# Patient Record
Sex: Female | Born: 1935 | Race: White | Hispanic: No | Smoking: Never smoker
Health system: Southern US, Community
[De-identification: ages and names within clinical notes are randomized; demographics above are authoritative.]

## PROBLEM LIST (undated history)

## (undated) DIAGNOSIS — H269 Unspecified cataract: Secondary | ICD-10-CM

## (undated) DIAGNOSIS — I10 Essential (primary) hypertension: Secondary | ICD-10-CM

## (undated) DIAGNOSIS — E119 Type 2 diabetes mellitus without complications: Secondary | ICD-10-CM

## (undated) HISTORY — DX: Essential (primary) hypertension: I10

## (undated) HISTORY — DX: Type 2 diabetes mellitus without complications: E11.9

## (undated) HISTORY — DX: Unspecified cataract: H26.9

## (undated) HISTORY — PX: EYE SURGERY: SHX253

## (undated) HISTORY — PX: HERNIA REPAIR: SHX51

---

## 2001-03-16 ENCOUNTER — Other Ambulatory Visit: Admission: RE | Admit: 2001-03-16 | Discharge: 2001-03-16 | Payer: Self-pay | Admitting: Internal Medicine

## 2001-03-31 ENCOUNTER — Encounter: Admission: RE | Admit: 2001-03-31 | Discharge: 2001-06-29 | Payer: Self-pay | Admitting: Internal Medicine

## 2001-10-20 ENCOUNTER — Encounter: Admission: RE | Admit: 2001-10-20 | Discharge: 2002-01-18 | Payer: Self-pay | Admitting: Internal Medicine

## 2004-03-25 ENCOUNTER — Observation Stay (HOSPITAL_COMMUNITY): Admission: RE | Admit: 2004-03-25 | Discharge: 2004-03-25 | Payer: Self-pay | Admitting: Orthopedic Surgery

## 2008-10-04 IMAGING — CR DG HAND*L* EXAM
1 series · 3 of 3 positions shown · non-contrast
Comparison: NONE

CLINICAL DATA: Small nodule. 

LEFT HAND

[Series 1: view not recorded · 0.17mm/px · 3 of 3 slices shown]
[im 1/3]
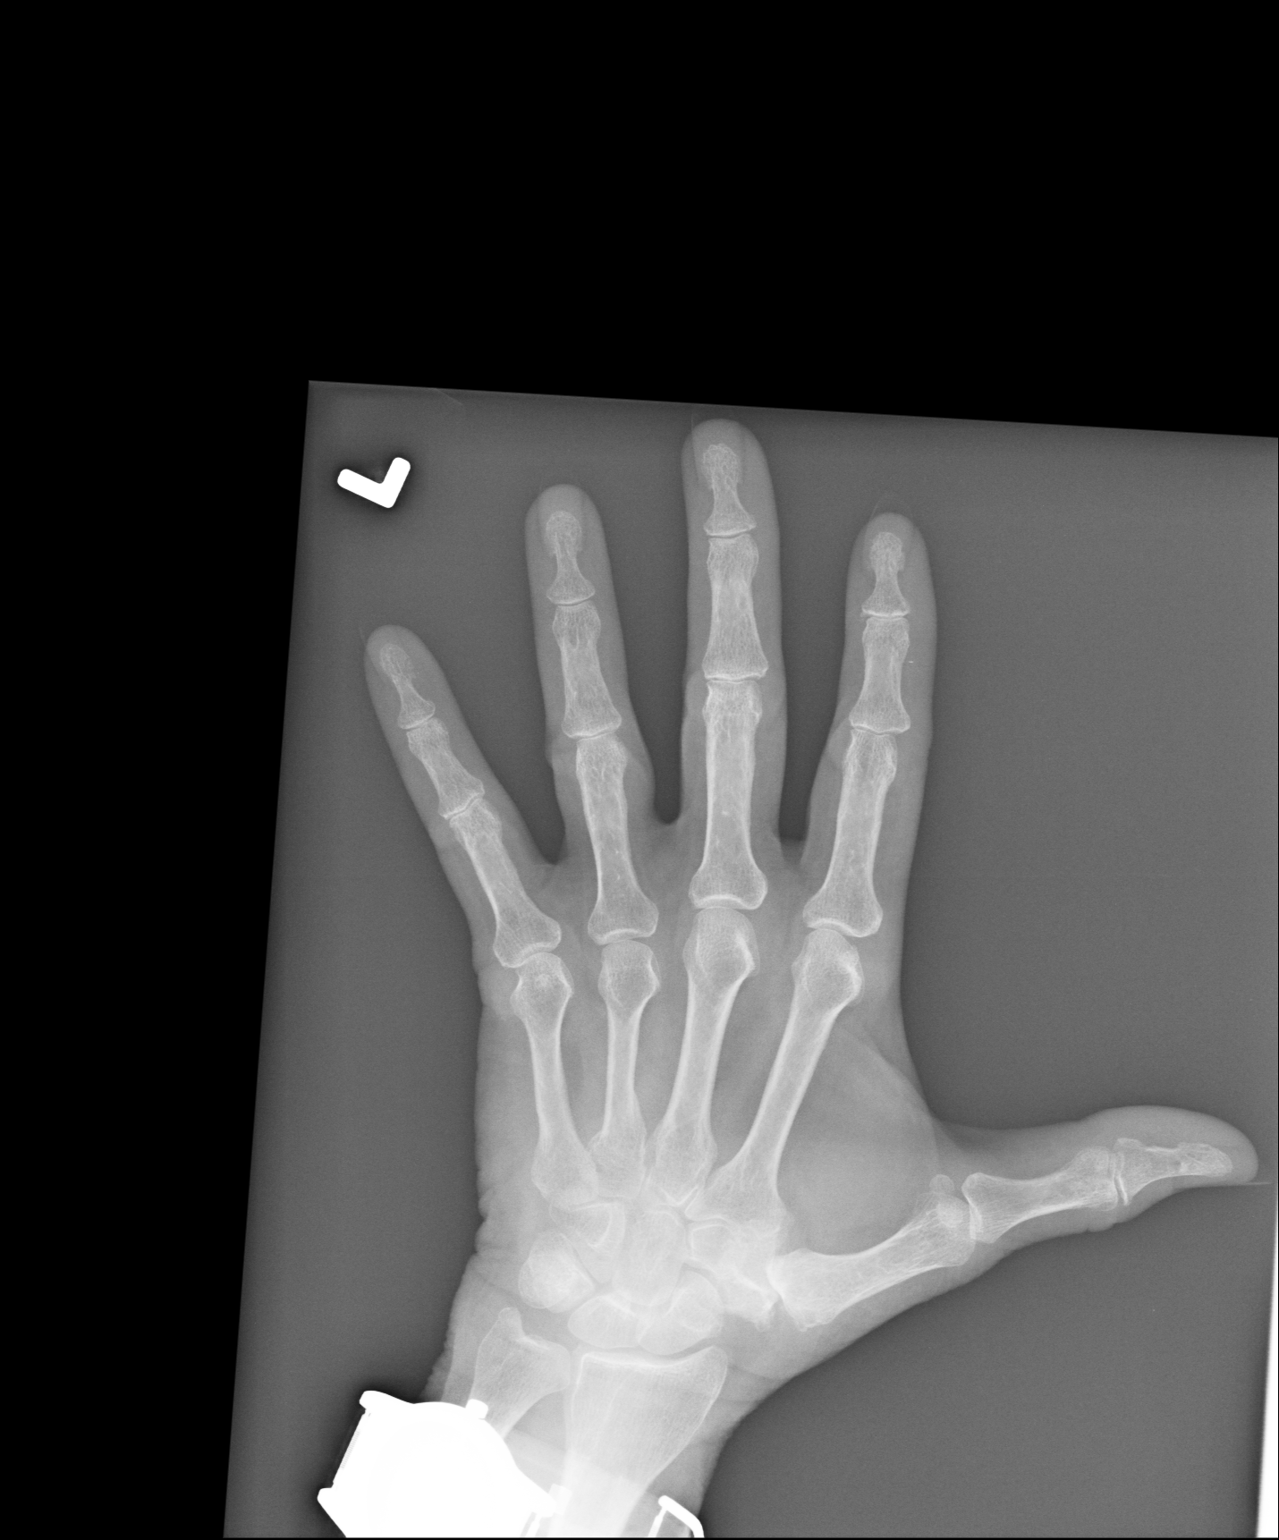
[im 2/3]
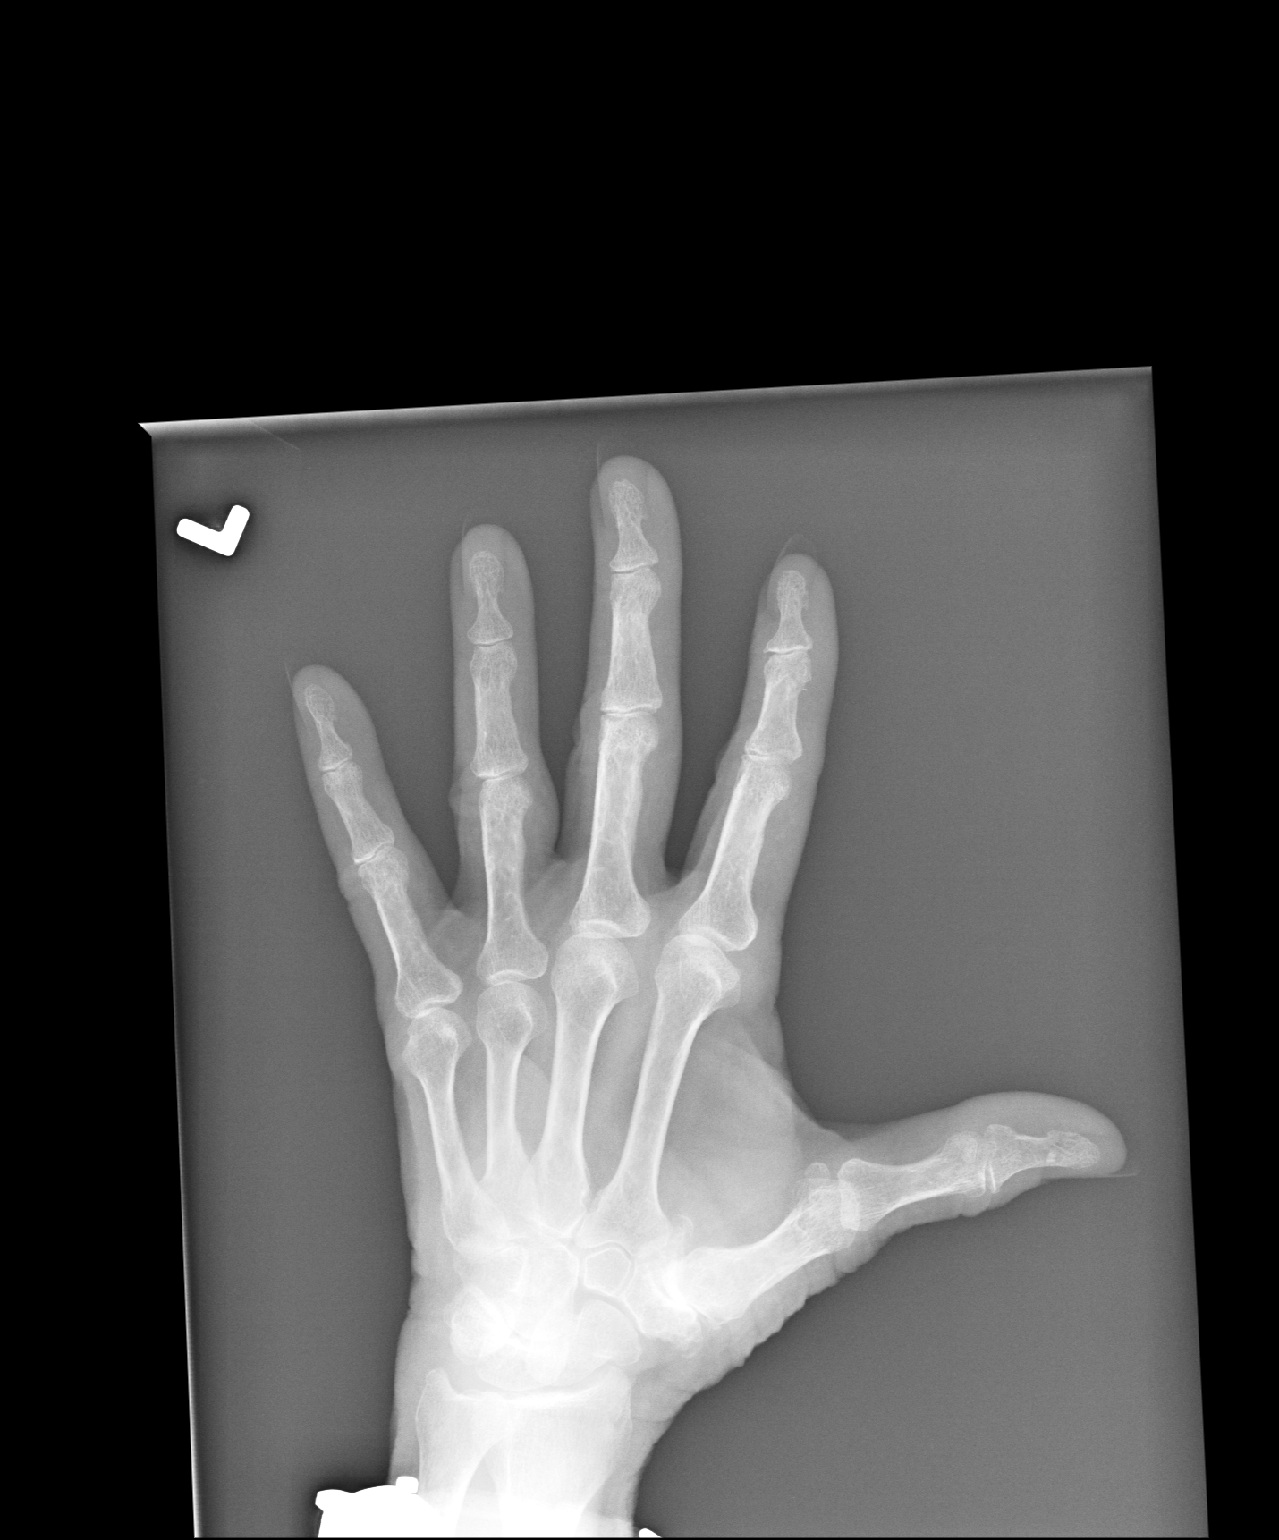
[im 3/3]
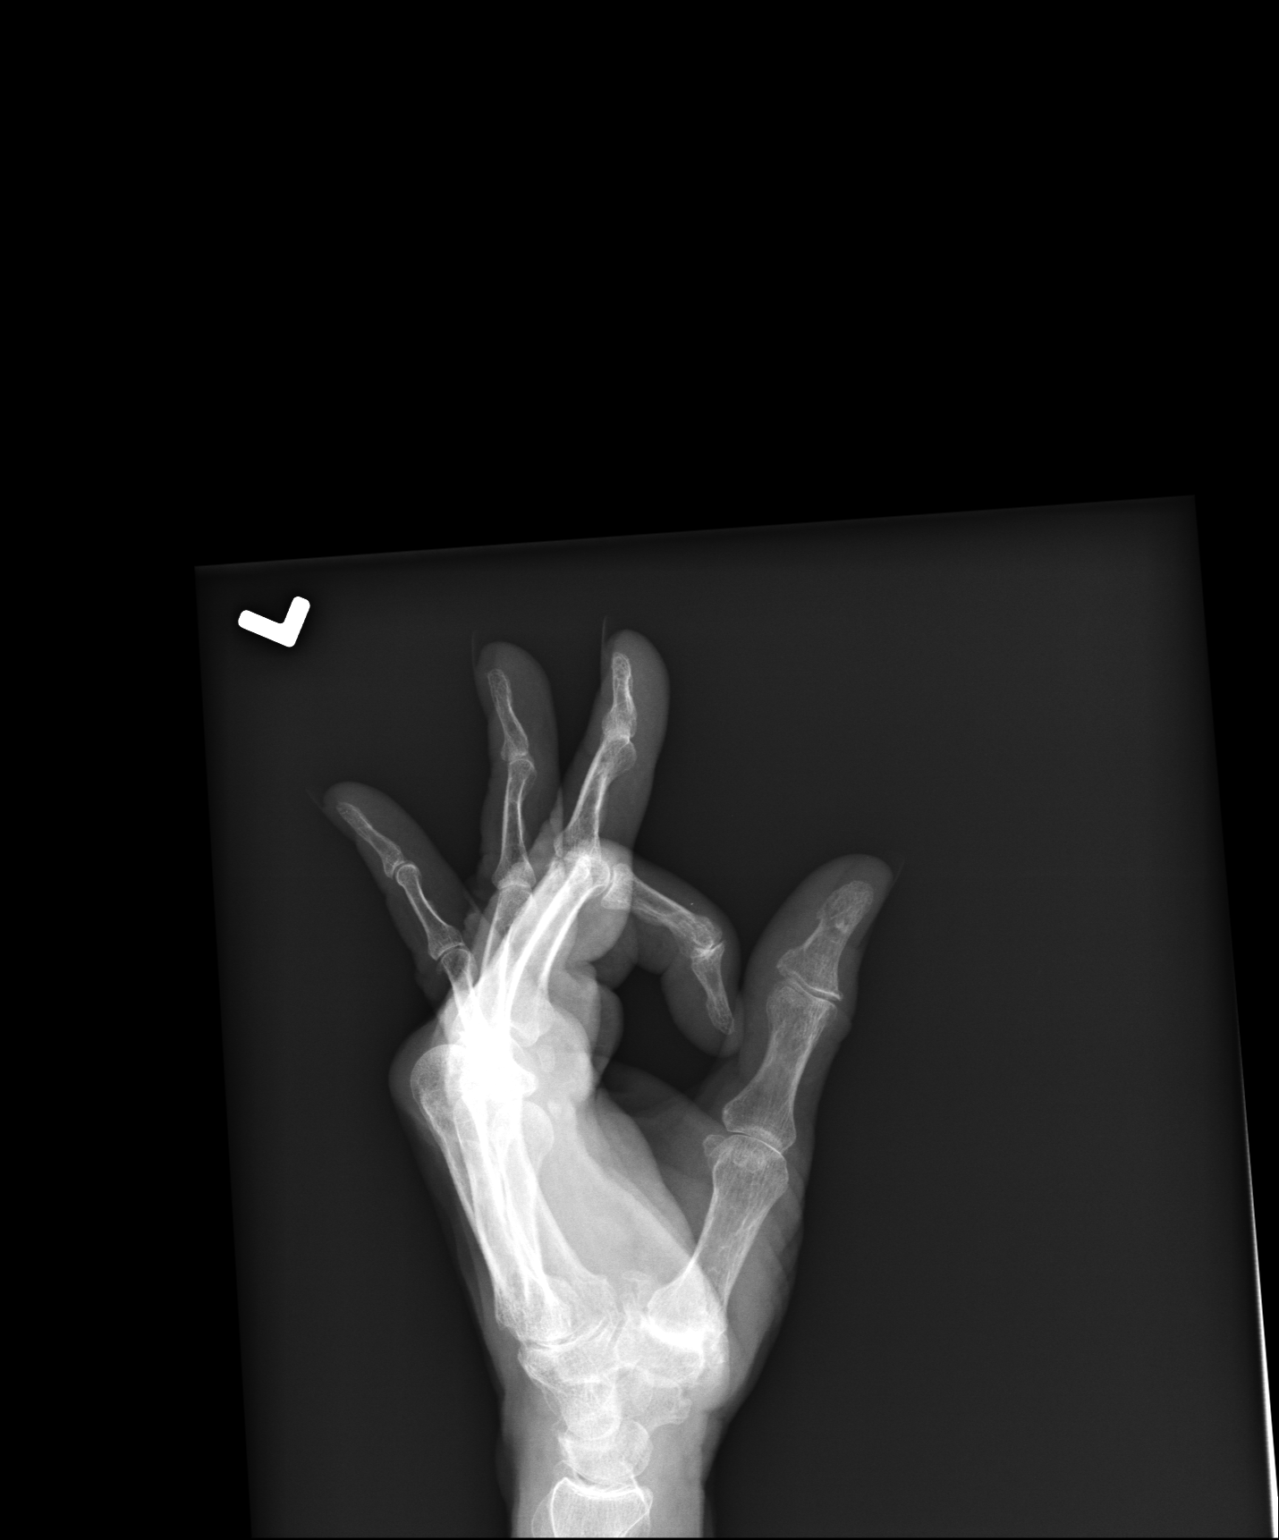

[3 of 3 positions shown; findings below may reference images not displayed]

FINDINGS: Scattered focal osteoarthritic changes are identified 
in the distal interphalangeal joints. An area of focal bony 
hypertrophic spurring is noted at the level of the trapezium, 
extending between the bases of metacarpals one and two. There is 
joint space narrowing of the trapezium and first metacarpal 
articulation.
IMPRESSION: Osteoarthritic changes at the base of the first 
metacarpal with hypertrophic bone spurring and joint space 
narrowing. The left hand is otherwise negative. Sashuna Bronw, 
Trans Date: 08/02/2008 [REDACTED]  [REDACTED]

## 2011-01-04 ENCOUNTER — Encounter: Payer: Self-pay | Admitting: Family Medicine

## 2012-03-28 DIAGNOSIS — J309 Allergic rhinitis, unspecified: Secondary | ICD-10-CM | POA: Diagnosis not present

## 2012-03-28 DIAGNOSIS — I129 Hypertensive chronic kidney disease with stage 1 through stage 4 chronic kidney disease, or unspecified chronic kidney disease: Secondary | ICD-10-CM | POA: Diagnosis not present

## 2012-03-28 DIAGNOSIS — E1129 Type 2 diabetes mellitus with other diabetic kidney complication: Secondary | ICD-10-CM | POA: Diagnosis not present

## 2012-03-28 DIAGNOSIS — Z1331 Encounter for screening for depression: Secondary | ICD-10-CM | POA: Diagnosis not present

## 2012-03-28 DIAGNOSIS — N182 Chronic kidney disease, stage 2 (mild): Secondary | ICD-10-CM | POA: Diagnosis not present

## 2012-03-28 DIAGNOSIS — E785 Hyperlipidemia, unspecified: Secondary | ICD-10-CM | POA: Diagnosis not present

## 2012-06-30 DIAGNOSIS — D235 Other benign neoplasm of skin of trunk: Secondary | ICD-10-CM | POA: Diagnosis not present

## 2012-08-25 DIAGNOSIS — D485 Neoplasm of uncertain behavior of skin: Secondary | ICD-10-CM | POA: Diagnosis not present

## 2012-08-25 DIAGNOSIS — L259 Unspecified contact dermatitis, unspecified cause: Secondary | ICD-10-CM | POA: Diagnosis not present

## 2012-09-23 ENCOUNTER — Ambulatory Visit (INDEPENDENT_AMBULATORY_CARE_PROVIDER_SITE_OTHER): Payer: Medicare Other | Admitting: Family Medicine

## 2012-09-23 VITALS — BP 159/76 | HR 65 | Temp 98.2°F | Resp 20 | Ht 61.0 in | Wt 128.0 lb

## 2012-09-23 DIAGNOSIS — J309 Allergic rhinitis, unspecified: Secondary | ICD-10-CM

## 2012-09-23 DIAGNOSIS — L989 Disorder of the skin and subcutaneous tissue, unspecified: Secondary | ICD-10-CM

## 2012-09-23 DIAGNOSIS — R040 Epistaxis: Secondary | ICD-10-CM

## 2012-09-23 MED ORDER — FEXOFENADINE HCL 180 MG PO TABS: 180.0000 mg | ORAL_TABLET | Freq: Every day | ORAL | Status: DC

## 2012-09-23 NOTE — Progress Notes (Signed)
@[facility] @   Patient ID: Angel Castaneda MRN: [id number], DOB: 07/20/1929, 78 y.o. Date of Encounter: 09/23/2012, 5:37 PM  Primary Physician: No primary provider on file.  Chief Complaint:  Chief Complaint  Patient presents with  . Sinusitis    Post nasal drainage.    . Cough  . Foreign Body in Nose    Thinks there may be an orange seed in her nose x today    HPI: 78 y.o. year old female presents with 7 day history of nasal congestion, post nasal drip, sore throat, sinus pressure, and cough. Afebrile. No chills. Nasal congestion thick and green/yellow. Sinus pressure is the worst symptom. Cough is productive secondary to post nasal drip and not associated with time of day. Ears feel full, leading to sensation of muffled hearing. Has tried OTC cold preps without success. No GI complaints. Thought she may have introduced a seed into left nostril this morning and has been picking at the nose all day.  She has had some epistaxis today.  Also has cerumen impaction  No recent antibiotics, recent travels, or sick contacts   No leg trauma, sedentary periods, h/o cancer, or tobacco use.  Past Medical History  Diagnosis Date  . Cataract   . Diabetes mellitus without complication     Borderline     Home Meds: Prior to Admission medications   Medication Sig Start Date End Date Taking? Authorizing Provider  losartan (COZAAR) 50 MG tablet Take 50 mg by mouth daily.   Yes Historical Provider, MD    Allergies:  Allergies  Allergen Reactions  . Codeine Nausea And Vomiting    Gets very ill.    History   Social History  . Marital Status: Widowed    Spouse Name: N/A    Number of Children: N/A  . Years of Education: N/A   Occupational History  . Not on file.   Social History Main Topics  . Smoking status: Never Smoker   . Smokeless tobacco: Not on file  . Alcohol Use: Not on file  . Drug Use: Not on file  . Sexually Active: Not on file   Other Topics Concern  . Not on  file   Social History Narrative  . No narrative on file     Review of Systems: Constitutional: negative for chills, fever, night sweats or weight changes Cardiovascular: negative for chest pain or palpitations Respiratory: negative for hemoptysis, wheezing, or shortness of breath Abdominal: negative for abdominal pain, nausea, vomiting or diarrhea Dermatological: negative for rash Neurologic: negative for headache   Physical Exam: Blood pressure 159/76, pulse 65, temperature 98.2 F (36.8 C), temperature source Oral, resp. rate 20, height 5\' 1"  (1.549 m), weight 128 lb (58.06 kg), SpO2 98.00%., Body mass index is 24.19 kg/(m^2). General: Well developed, well nourished, in no acute distress. Head: Normocephalic, atraumatic, eyes without discharge, sclera non-icteric, nares are congested. Bilateral auditory canals clear after lavage, Cerumen impaction bilaterally. Maxillary sinus TTP. Oral cavity moist, dentition normal. Posterior pharynx with post nasal drip and mild erythema. No peritonsillar abscess or tonsillar exudate.  Nose shows dry fresh blood left septum but no F.B. Neck: Supple. No thyromegaly. Full ROM. No lymphadenopathy. Lungs: Clear bilaterally to auscultation without wheezes, rales, or rhonchi. Breathing is unlabored.  Heart: RRR with S1 S2. No murmurs, rubs, or gallops appreciated. Msk:  Strength and tone normal for age. Extremities: No clubbing or cyanosis. No edema. Neuro: Alert and oriented X 3. Moves all extremities spontaneously. CNII-XII grossly in tact.  Psych:  Responds to questions appropriately with a normal affect.  Skin:  Red macular lesion 1 mm on dorsum or left hand Labs:   ASSESSMENT AND PLAN:  78 y.o. year old female with allergic rhinitis, skin lesion left hand (being treated by dermatologist) -  -Tylenol/Motrin prn -Rest/fluids -RTC precautions -RTC 3-5 days if no improvement  Signed, Elvina Sidle, MD 09/23/2012 5:37 PM

## 2012-10-17 DIAGNOSIS — I129 Hypertensive chronic kidney disease with stage 1 through stage 4 chronic kidney disease, or unspecified chronic kidney disease: Secondary | ICD-10-CM | POA: Diagnosis not present

## 2012-10-17 DIAGNOSIS — R109 Unspecified abdominal pain: Secondary | ICD-10-CM | POA: Diagnosis not present

## 2012-10-17 DIAGNOSIS — N182 Chronic kidney disease, stage 2 (mild): Secondary | ICD-10-CM | POA: Diagnosis not present

## 2012-10-17 DIAGNOSIS — E785 Hyperlipidemia, unspecified: Secondary | ICD-10-CM | POA: Diagnosis not present

## 2012-10-17 DIAGNOSIS — E1129 Type 2 diabetes mellitus with other diabetic kidney complication: Secondary | ICD-10-CM | POA: Diagnosis not present

## 2012-12-14 ENCOUNTER — Ambulatory Visit: Payer: Medicare Other

## 2012-12-14 ENCOUNTER — Ambulatory Visit (INDEPENDENT_AMBULATORY_CARE_PROVIDER_SITE_OTHER): Payer: Medicare Other | Admitting: Family Medicine

## 2012-12-14 VITALS — BP 175/76 | HR 80 | Temp 98.1°F | Resp 18 | Ht 61.0 in | Wt 123.6 lb

## 2012-12-14 DIAGNOSIS — S40029A Contusion of unspecified upper arm, initial encounter: Secondary | ICD-10-CM

## 2012-12-14 DIAGNOSIS — M79609 Pain in unspecified limb: Secondary | ICD-10-CM

## 2012-12-14 DIAGNOSIS — S42253A Displaced fracture of greater tuberosity of unspecified humerus, initial encounter for closed fracture: Secondary | ICD-10-CM

## 2012-12-14 DIAGNOSIS — S40022A Contusion of left upper arm, initial encounter: Secondary | ICD-10-CM

## 2012-12-14 DIAGNOSIS — M79602 Pain in left arm: Secondary | ICD-10-CM

## 2012-12-14 NOTE — Progress Notes (Signed)
[address]   [place], [place]  [address]   [phone]  Subjective:    Patient ID: Angel Castaneda, female    DOB: 1929/08/24, 79 y.o.   MRN: [id number]  HPIThis 79 y.o. female presents for evaluation of arm pain after fall one week ago.  Larey Seat two weeks ago; did not tell anyone due to holidays.  Raining and going down steps; slipped down steps and fell into yard.  Fell forward on outstretched arms; injured L arm.  R hand dominant.  No head trauma.  Had coat on which cushioned fall.  Pain along upper arm along tricep and elbow; also radiates into forearm.  +bruise proximal arm.  No n/t in arm.  Good range of motion.  No swelling to arm.  No associated neck pain.  Taken Tylenol ES.  Did not take medication last night other than ASA 81mg .   No history of osteoporosis; history of bone density scan in past but not sure of results.  No dizziness, lightheadedness, diaphoresis prior to fall.  PCP:  Laurann Montana with [facility].   Review of Systems  Constitutional: Negative for fever, chills, diaphoresis and fatigue.  Musculoskeletal: Positive for myalgias and arthralgias. Negative for joint swelling.  Neurological: Negative for weakness and numbness.  Hematological: Bruises/bleeds easily.        Past Medical History  Diagnosis Date  . Cataract   . Diabetes mellitus without complication     Borderline  . Hypertension     Past Surgical History  Procedure Date  . Eye surgery   . Hernia repair     Prior to Admission medications   Medication Sig Start Date End Date Taking? Authorizing Provider  losartan (COZAAR) 50 MG tablet Take 50 mg by mouth daily.   Yes Historical Provider, MD  fexofenadine (ALLEGRA) 180 MG tablet Take 1 tablet (180 mg total) by mouth daily. 09/23/12   Elvina Sidle, MD    Allergies  Allergen Reactions  . Codeine Nausea And Vomiting    Gets very ill.    History   Social History  . Marital Status: Widowed    Spouse Name: N/A    Number of Children: N/A    . Years of Education: N/A   Occupational History  . Not on file.   Social History Main Topics  . Smoking status: Never Smoker   . Smokeless tobacco: Not on file  . Alcohol Use: Not on file  . Drug Use: Not on file  . Sexually Active: Not on file   Other Topics Concern  . Not on file   Social History Narrative   Marital status:  Widowed   Children: four children; 2 grandchildren; 1 great grandchild   Lives: with son   Employment: retired   Tobacco: none   Alcohol:  None   Exercise:  walking    Family History  Problem Relation Age of Onset  . Diabetes Mother   . Heart disease Sister   . Stroke Sister   . Mental illness Son   . Diabetes Maternal Aunt   . Cancer Maternal Aunt   . Heart disease Maternal Uncle   . Diabetes Maternal Grandmother   . Heart disease Maternal Grandfather     Objective:   Physical Exam  Nursing note and vitals reviewed. Constitutional: She is oriented to person, place, and time. She appears well-developed and well-nourished. No distress.  Neck: Normal range of motion. Neck supple.  Cardiovascular: Normal rate, regular rhythm and normal heart sounds.  Exam reveals no gallop and no friction rub.   No murmur heard. Pulses:      Radial pulses are 2+ on the right side, and 2+ on the left side.       Capillary refill < 3 seconds LUE.  Pulmonary/Chest: Effort normal and breath sounds normal. No respiratory distress. She has no wheezes. She has no rales.  Musculoskeletal:       Left shoulder: She exhibits normal range of motion, no tenderness, no bony tenderness, no swelling, no effusion and no deformity.       Cervical back: She exhibits normal range of motion, no tenderness and no bony tenderness.       Left upper arm: She exhibits tenderness and bony tenderness. She exhibits no swelling, no edema, no deformity and no laceration.       Left forearm: She exhibits no tenderness, no bony tenderness, no swelling and no edema.       Left hand: She exhibits  normal range of motion, no tenderness and no bony tenderness. normal sensation noted. Normal strength noted.       CERVICAL SPINE: FULL ROM; NON-TENDER TO PALPATION PARASPINAL AND MIDLINE. SHOULDER L: FULL ROM L SHOULDER WITHOUT PAIN OR LIMITATION; CROSS OVER NEGATIVE; EMPTY CAN SIGN NEGATIVE.  +TTP L HUMERUS; SMALL ECCHYMOSES BICEPS REGION. NO SWELLING/INDURATION. ELBOW L: NON-TENDER TO PALPATION ELBOW JOINT; NO PAIN WITH SUPINATION AND PRONATION. NEUROVASCULARLY INTACT.   L WRIST: FULL ROM WRIST; NON-TENDER TO PALPATION.   Lymphadenopathy:    She has no cervical adenopathy.  Neurological: She is alert and oriented to person, place, and time. No sensory deficit.  Skin: Skin is warm and dry. No rash noted. She is not diaphoretic. No erythema. No pallor.       Small ecchymoses L bicep region 1 cm diameter; no induration.  Psychiatric: She has a normal mood and affect. Her behavior is normal.      [facility] reading (PRIMARY) by  Dr. Katrinka Blazing.  L HUMERUS:  NAD.  OVER-READ BY RADIOLOGY:  +NON-DISPLACED AVULSION FRACTURE OF GREATER TUBEROSITY OF HUMERUS.   Assessment & Plan:   1. Arm pain, left  DG Humerus Left  2.  L Humerus Avulsion Fracture of Greater Tuberosity   1.  L Humerus Avulsion Fracture of Greater Tuberosity:  New.  Pt contacted after visit regarding results of over-read by radiologist.  Advised to wear sling but pt declined.  Refer to ortho for further management; pt agreeable.  Advised to avoid use of arm until evaluation by ortho. Neurovascularly intact.

## 2012-12-14 NOTE — Patient Instructions (Addendum)
1. Arm pain, left  DG Humerus Left  2. Contusion of arm, left    3. Greater tuberosity of humerus fracture  Ambulatory referral to Orthopedic Surgery

## 2012-12-23 ENCOUNTER — Telehealth: Payer: Self-pay | Admitting: Family Medicine

## 2012-12-23 NOTE — Telephone Encounter (Signed)
Pt is needing to talk with dr Katrinka Blazing, about her arm.  We did refer patient but from what I can understand from her she is not going to schedule with [facility] until she talks with Dr, Katrinka Blazing again

## 2012-12-23 NOTE — Telephone Encounter (Signed)
I have spoken to patient, her pain is still present, but she wants to know if she really needs to see the Orthopedic doctor.  She does not want to go through a large work up with this. She states this is her non dominant arm, and she does not want to go through scans, additional office visits, etc wants to know if it is okay if she does not go for the ortho appt. They have called her to schedule but she has not made the appt yet.

## 2012-12-23 NOTE — Telephone Encounter (Signed)
Yes, I do think it is necessary to see the orthopedic physician.  I do not think she will need to go through a lot of extensive testing and visits.  We need to confirm that bone is healing well, that there is good placement of healing bone, and good blood supply to bone for healing.  I highly recommend her make appointment.

## 2012-12-26 NOTE — Telephone Encounter (Signed)
LMOM w/female who answered phone. He stated he would have pt call right back.

## 2012-12-26 NOTE — Telephone Encounter (Signed)
Pt CB and after some discussion she stated that she would make an appt w/ortho to check for proper healing. She did state that her arm is feeling much better so she thinks that it is healing. Asked Xray to make copy of her xray to take to ortho and advised pt to come by and p/up. Pt agreed.  Xray copied and in drawer for p/up.

## 2012-12-29 DIAGNOSIS — M25519 Pain in unspecified shoulder: Secondary | ICD-10-CM | POA: Diagnosis not present

## 2013-05-26 DIAGNOSIS — I129 Hypertensive chronic kidney disease with stage 1 through stage 4 chronic kidney disease, or unspecified chronic kidney disease: Secondary | ICD-10-CM | POA: Diagnosis not present

## 2013-05-26 DIAGNOSIS — N182 Chronic kidney disease, stage 2 (mild): Secondary | ICD-10-CM | POA: Diagnosis not present

## 2013-05-26 DIAGNOSIS — E1129 Type 2 diabetes mellitus with other diabetic kidney complication: Secondary | ICD-10-CM | POA: Diagnosis not present

## 2013-05-26 DIAGNOSIS — E785 Hyperlipidemia, unspecified: Secondary | ICD-10-CM | POA: Diagnosis not present

## 2013-10-12 DIAGNOSIS — I129 Hypertensive chronic kidney disease with stage 1 through stage 4 chronic kidney disease, or unspecified chronic kidney disease: Secondary | ICD-10-CM | POA: Diagnosis not present

## 2013-10-12 DIAGNOSIS — J329 Chronic sinusitis, unspecified: Secondary | ICD-10-CM | POA: Diagnosis not present

## 2013-10-12 DIAGNOSIS — E1129 Type 2 diabetes mellitus with other diabetic kidney complication: Secondary | ICD-10-CM | POA: Diagnosis not present

## 2013-10-12 DIAGNOSIS — E785 Hyperlipidemia, unspecified: Secondary | ICD-10-CM | POA: Diagnosis not present

## 2013-10-12 DIAGNOSIS — N182 Chronic kidney disease, stage 2 (mild): Secondary | ICD-10-CM | POA: Diagnosis not present

## 2013-10-12 DIAGNOSIS — R0989 Other specified symptoms and signs involving the circulatory and respiratory systems: Secondary | ICD-10-CM | POA: Diagnosis not present

## 2013-10-13 ENCOUNTER — Other Ambulatory Visit: Payer: Self-pay | Admitting: Family Medicine

## 2013-10-13 DIAGNOSIS — R0989 Other specified symptoms and signs involving the circulatory and respiratory systems: Secondary | ICD-10-CM

## 2013-10-19 ENCOUNTER — Other Ambulatory Visit: Payer: 59

## 2014-03-30 DIAGNOSIS — E1129 Type 2 diabetes mellitus with other diabetic kidney complication: Secondary | ICD-10-CM | POA: Diagnosis not present

## 2014-03-30 DIAGNOSIS — N182 Chronic kidney disease, stage 2 (mild): Secondary | ICD-10-CM | POA: Diagnosis not present

## 2014-03-30 DIAGNOSIS — I129 Hypertensive chronic kidney disease with stage 1 through stage 4 chronic kidney disease, or unspecified chronic kidney disease: Secondary | ICD-10-CM | POA: Diagnosis not present

## 2014-03-30 DIAGNOSIS — M545 Low back pain, unspecified: Secondary | ICD-10-CM | POA: Diagnosis not present

## 2014-03-30 DIAGNOSIS — E785 Hyperlipidemia, unspecified: Secondary | ICD-10-CM | POA: Diagnosis not present

## 2014-07-31 DIAGNOSIS — H6120 Impacted cerumen, unspecified ear: Secondary | ICD-10-CM | POA: Diagnosis not present

## 2014-07-31 DIAGNOSIS — M549 Dorsalgia, unspecified: Secondary | ICD-10-CM | POA: Diagnosis not present

## 2014-07-31 DIAGNOSIS — H109 Unspecified conjunctivitis: Secondary | ICD-10-CM | POA: Diagnosis not present

## 2014-10-01 DIAGNOSIS — I129 Hypertensive chronic kidney disease with stage 1 through stage 4 chronic kidney disease, or unspecified chronic kidney disease: Secondary | ICD-10-CM | POA: Diagnosis not present

## 2014-10-01 DIAGNOSIS — E1121 Type 2 diabetes mellitus with diabetic nephropathy: Secondary | ICD-10-CM | POA: Diagnosis not present

## 2014-10-01 DIAGNOSIS — M62830 Muscle spasm of back: Secondary | ICD-10-CM | POA: Diagnosis not present

## 2014-10-01 DIAGNOSIS — R0989 Other specified symptoms and signs involving the circulatory and respiratory systems: Secondary | ICD-10-CM | POA: Diagnosis not present

## 2014-10-01 DIAGNOSIS — Z1389 Encounter for screening for other disorder: Secondary | ICD-10-CM | POA: Diagnosis not present

## 2014-10-01 DIAGNOSIS — E785 Hyperlipidemia, unspecified: Secondary | ICD-10-CM | POA: Diagnosis not present

## 2014-10-01 DIAGNOSIS — N182 Chronic kidney disease, stage 2 (mild): Secondary | ICD-10-CM | POA: Diagnosis not present

## 2015-07-29 ENCOUNTER — Other Ambulatory Visit: Payer: Self-pay | Admitting: Family Medicine

## 2015-07-29 DIAGNOSIS — N182 Chronic kidney disease, stage 2 (mild): Secondary | ICD-10-CM | POA: Diagnosis not present

## 2015-07-29 DIAGNOSIS — E785 Hyperlipidemia, unspecified: Secondary | ICD-10-CM | POA: Diagnosis not present

## 2015-07-29 DIAGNOSIS — E041 Nontoxic single thyroid nodule: Secondary | ICD-10-CM

## 2015-07-29 DIAGNOSIS — I129 Hypertensive chronic kidney disease with stage 1 through stage 4 chronic kidney disease, or unspecified chronic kidney disease: Secondary | ICD-10-CM | POA: Diagnosis not present

## 2015-07-29 DIAGNOSIS — E1121 Type 2 diabetes mellitus with diabetic nephropathy: Secondary | ICD-10-CM | POA: Diagnosis not present

## 2015-07-29 DIAGNOSIS — F439 Reaction to severe stress, unspecified: Secondary | ICD-10-CM | POA: Diagnosis not present

## 2015-08-12 ENCOUNTER — Ambulatory Visit
Admission: RE | Admit: 2015-08-12 | Discharge: 2015-08-12 | Disposition: A | Discharge status: Home / Self Care | Payer: Medicare Other | Source: Ambulatory Visit | Attending: Family Medicine | Admitting: Family Medicine

## 2015-08-12 DIAGNOSIS — E041 Nontoxic single thyroid nodule: Secondary | ICD-10-CM

## 2015-08-12 DIAGNOSIS — E042 Nontoxic multinodular goiter: Secondary | ICD-10-CM | POA: Diagnosis not present

## 2015-08-14 ENCOUNTER — Other Ambulatory Visit: Payer: Self-pay | Admitting: Family Medicine

## 2015-08-14 DIAGNOSIS — E041 Nontoxic single thyroid nodule: Secondary | ICD-10-CM

## 2015-09-03 ENCOUNTER — Inpatient Hospital Stay: Admission: RE | Admit: 2015-09-03 | Payer: Medicare Other | Source: Ambulatory Visit

## 2015-09-10 ENCOUNTER — Other Ambulatory Visit (HOSPITAL_COMMUNITY)
Admission: RE | Admit: 2015-09-10 | Discharge: 2015-09-10 | Disposition: A | Discharge status: Home / Self Care | Payer: Medicare Other | Source: Ambulatory Visit | Attending: Physician Assistant | Admitting: Physician Assistant

## 2015-09-10 ENCOUNTER — Ambulatory Visit
Admission: RE | Admit: 2015-09-10 | Discharge: 2015-09-10 | Disposition: A | Discharge status: Home / Self Care | Payer: Medicare Other | Source: Ambulatory Visit | Attending: Family Medicine | Admitting: Family Medicine

## 2015-09-10 DIAGNOSIS — E041 Nontoxic single thyroid nodule: Secondary | ICD-10-CM | POA: Insufficient documentation

## 2015-09-10 NOTE — Procedures (Signed)
Using direct ultrasound guidance, 5 passes were made using needles into the nodule within the right lobe of the thyroid.   Ultrasound was used to confirm needle placements on all occasions.   Specimens were sent to Pathology for analysis.  Judie Grieve [place] PA-C 09/10/2015 4:57 PM

## 2015-10-08 ENCOUNTER — Other Ambulatory Visit: Payer: Self-pay | Admitting: Endocrinology

## 2015-10-08 DIAGNOSIS — E049 Nontoxic goiter, unspecified: Secondary | ICD-10-CM

## 2015-11-12 IMAGING — US US THYROID BIOPSY
1 series · 13 of 19 positions shown · non-contrast
Comparison: Ultrasound done 08/12/2015

CLINICAL DATA: A dominant thyroid nodule which measures 2.2 cm,
mildly complex predominantly cystic, in the left mid gland. Request
for ultrasound guided fine needle aspirate biopsy

EXAM:
ULTRASOUND GUIDED NEEDLE ASPIRATE BIOPSY OF THE THYROID GLAND

[Series 1: us thyroid biopsy · 0.05mm/px · 19 acquisitions, 13 frames shown]
[im 1/19]
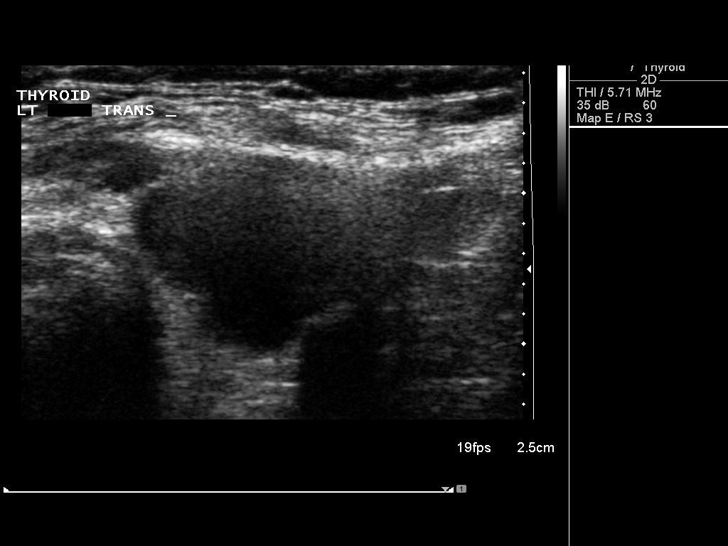
[im 3/19]
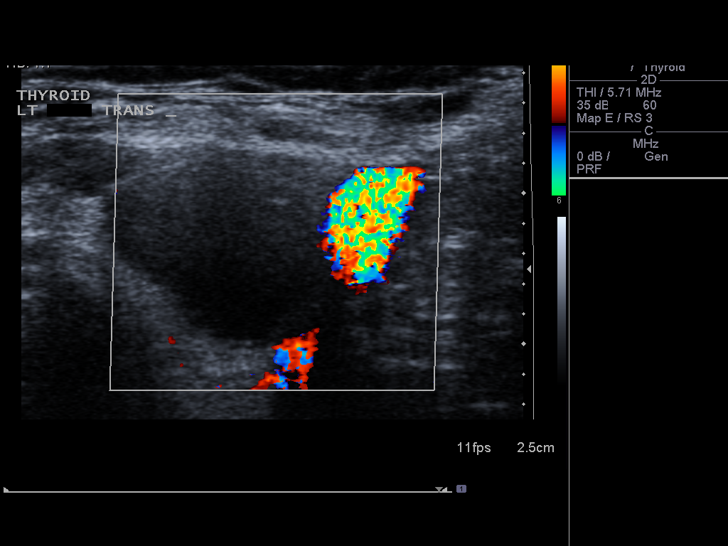
[im 4/19]
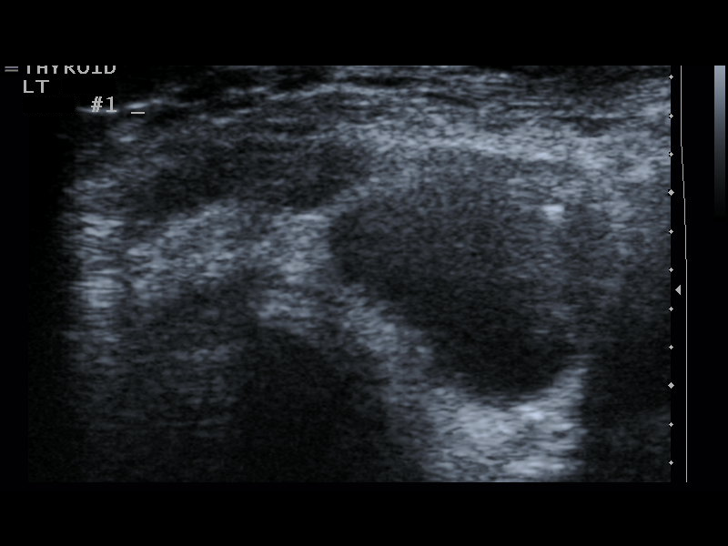
[im 6/19]
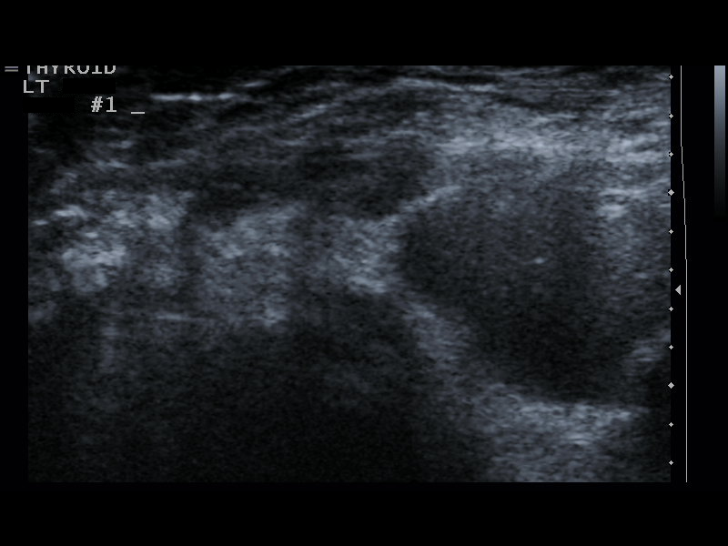
[im 7/19]
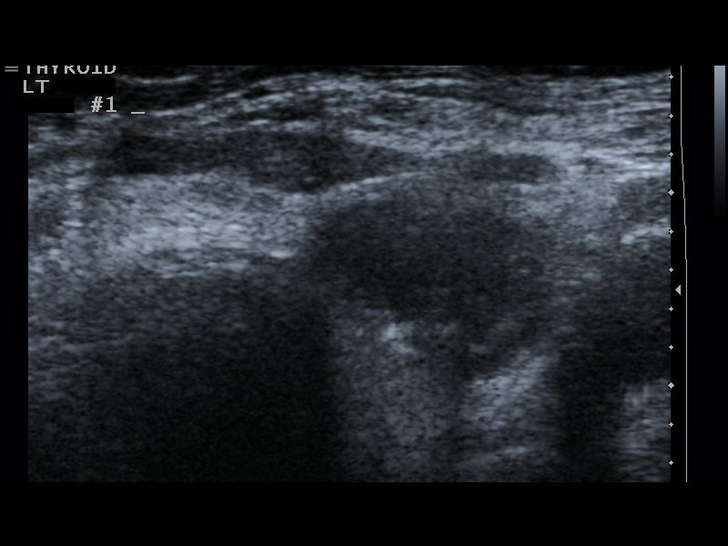
[im 9/19]
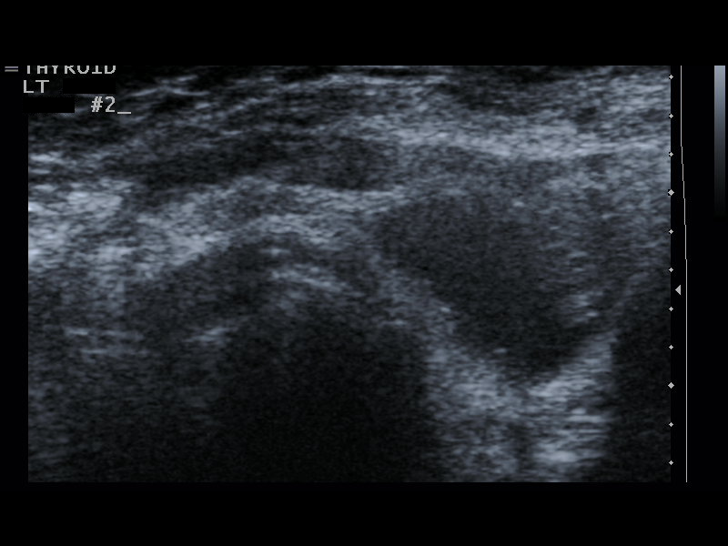
[im 10/19]
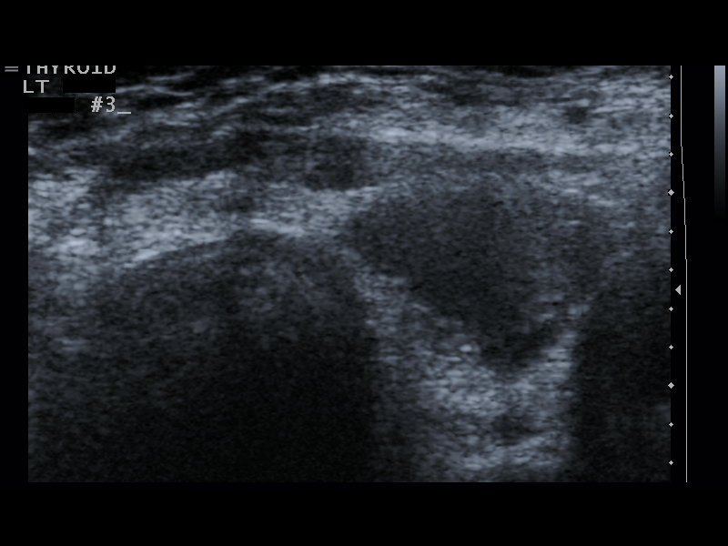
[im 11/19]
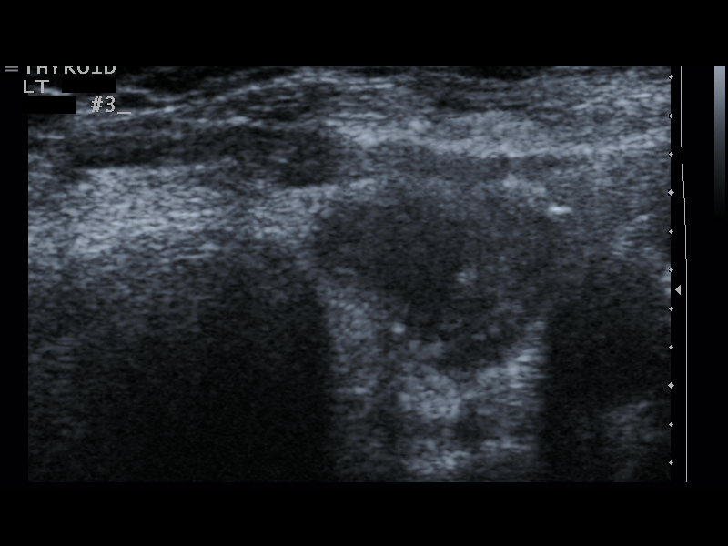
[im 13/19]
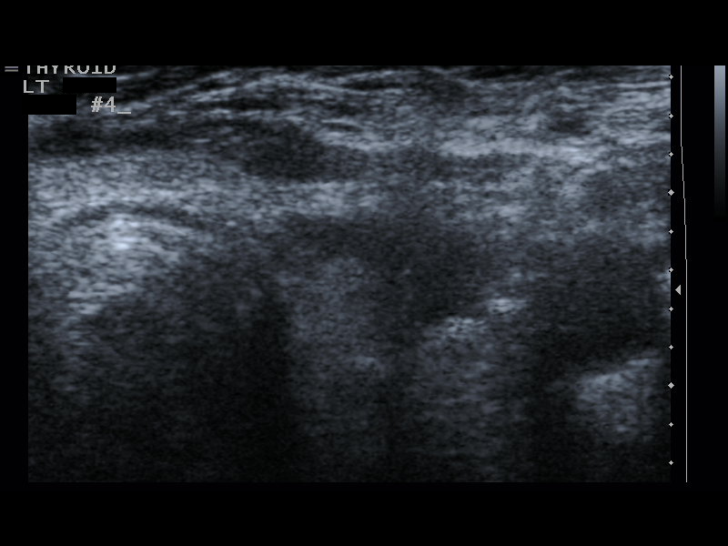
[im 14/19]
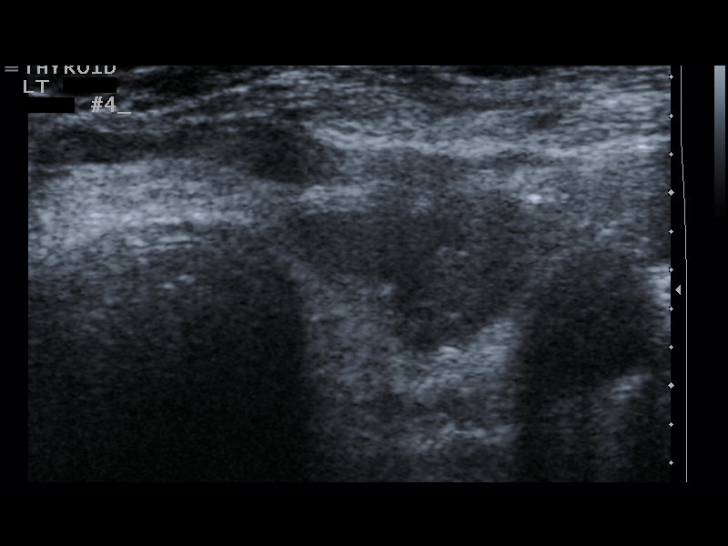
[im 16/19]
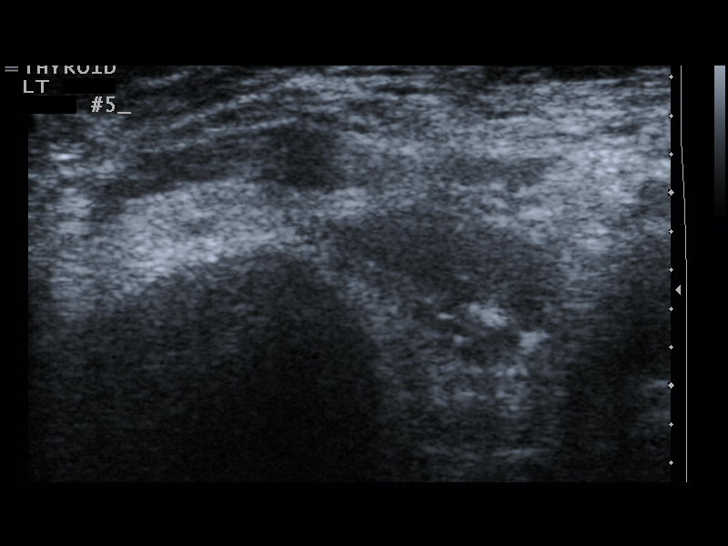
[im 17/19]
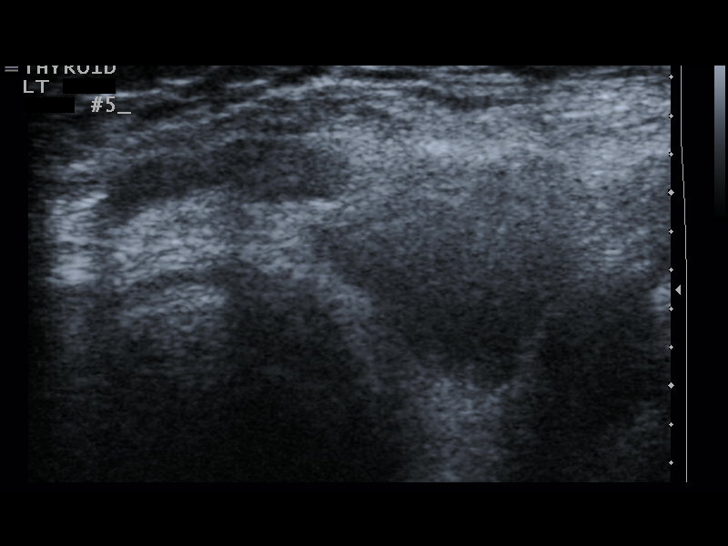
[im 19/19]
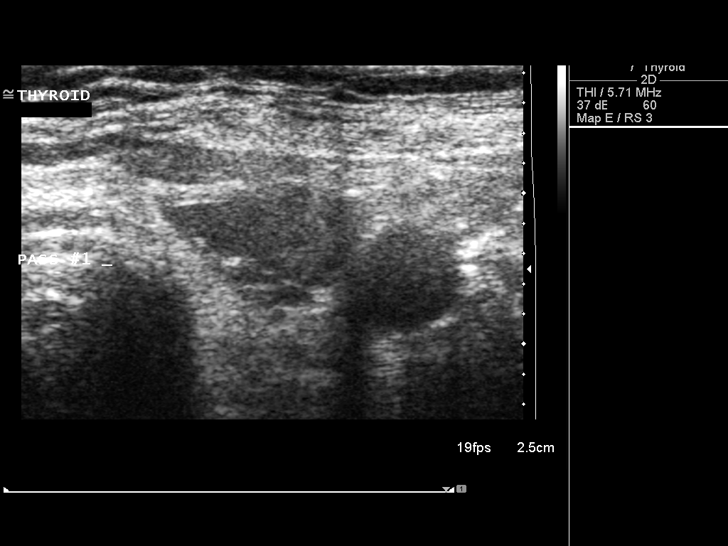

[13 of 19 positions shown; findings below may reference images not displayed]

PROCEDURE:
Thyroid biopsy was thoroughly discussed with the patient and
questions were answered. The benefits, risks, alternatives, and
complications were also discussed. The patient understands and
wishes to proceed with the procedure. Written consent was obtained.

Ultrasound was performed to localize and mark an adequate site for
the biopsy. The patient was then prepped and draped in a normal
sterile fashion. Local anesthesia was provided with 1% lidocaine.
Using direct ultrasound guidance, 5 passes were made using needles
into the nodule within the left lobe of the thyroid. Ultrasound was
used to confirm needle placements on all occasions. Specimens were
sent to Pathology for analysis.

Complications:  None immediate
FINDINGS: Imaging confirms needle placement into the dominant left thyroid
nodule.
IMPRESSION: Ultrasound guided needle aspirate biopsy performed of the left
thyroid nodule.

## 2015-11-20 DIAGNOSIS — M79675 Pain in left toe(s): Secondary | ICD-10-CM | POA: Diagnosis not present

## 2015-11-20 DIAGNOSIS — L03032 Cellulitis of left toe: Secondary | ICD-10-CM | POA: Diagnosis not present

## 2015-11-20 DIAGNOSIS — M79674 Pain in right toe(s): Secondary | ICD-10-CM | POA: Diagnosis not present

## 2015-12-05 DIAGNOSIS — T8189XA Other complications of procedures, not elsewhere classified, initial encounter: Secondary | ICD-10-CM | POA: Diagnosis not present

## 2015-12-19 DIAGNOSIS — T8189XS Other complications of procedures, not elsewhere classified, sequela: Secondary | ICD-10-CM | POA: Diagnosis not present

## 2016-01-02 DIAGNOSIS — L600 Ingrowing nail: Secondary | ICD-10-CM | POA: Diagnosis not present

## 2016-01-29 DIAGNOSIS — M545 Low back pain: Secondary | ICD-10-CM | POA: Diagnosis not present

## 2016-01-29 DIAGNOSIS — I129 Hypertensive chronic kidney disease with stage 1 through stage 4 chronic kidney disease, or unspecified chronic kidney disease: Secondary | ICD-10-CM | POA: Diagnosis not present

## 2016-01-29 DIAGNOSIS — E785 Hyperlipidemia, unspecified: Secondary | ICD-10-CM | POA: Diagnosis not present

## 2016-01-29 DIAGNOSIS — E1121 Type 2 diabetes mellitus with diabetic nephropathy: Secondary | ICD-10-CM | POA: Diagnosis not present

## 2016-01-29 DIAGNOSIS — N182 Chronic kidney disease, stage 2 (mild): Secondary | ICD-10-CM | POA: Diagnosis not present

## 2016-03-25 ENCOUNTER — Other Ambulatory Visit: Payer: Medicare Other

## 2016-04-08 DIAGNOSIS — E049 Nontoxic goiter, unspecified: Secondary | ICD-10-CM | POA: Diagnosis not present

## 2016-04-13 ENCOUNTER — Other Ambulatory Visit: Payer: Self-pay | Admitting: Endocrinology

## 2016-04-13 DIAGNOSIS — E049 Nontoxic goiter, unspecified: Secondary | ICD-10-CM

## 2016-06-15 DIAGNOSIS — T148 Other injury of unspecified body region: Secondary | ICD-10-CM | POA: Diagnosis not present

## 2016-06-15 DIAGNOSIS — M546 Pain in thoracic spine: Secondary | ICD-10-CM | POA: Diagnosis not present

## 2016-07-23 DIAGNOSIS — L709 Acne, unspecified: Secondary | ICD-10-CM | POA: Diagnosis not present

## 2016-07-23 DIAGNOSIS — D485 Neoplasm of uncertain behavior of skin: Secondary | ICD-10-CM | POA: Diagnosis not present

## 2016-07-23 DIAGNOSIS — L570 Actinic keratosis: Secondary | ICD-10-CM | POA: Diagnosis not present

## 2016-07-23 DIAGNOSIS — L923 Foreign body granuloma of the skin and subcutaneous tissue: Secondary | ICD-10-CM | POA: Diagnosis not present

## 2016-08-12 DIAGNOSIS — E1121 Type 2 diabetes mellitus with diabetic nephropathy: Secondary | ICD-10-CM | POA: Diagnosis not present

## 2016-08-12 DIAGNOSIS — N814 Uterovaginal prolapse, unspecified: Secondary | ICD-10-CM | POA: Diagnosis not present

## 2016-08-12 DIAGNOSIS — H6123 Impacted cerumen, bilateral: Secondary | ICD-10-CM | POA: Diagnosis not present

## 2016-08-12 DIAGNOSIS — E785 Hyperlipidemia, unspecified: Secondary | ICD-10-CM | POA: Diagnosis not present

## 2016-08-12 DIAGNOSIS — E781 Pure hyperglyceridemia: Secondary | ICD-10-CM | POA: Diagnosis not present

## 2016-08-12 DIAGNOSIS — N951 Menopausal and female climacteric states: Secondary | ICD-10-CM | POA: Diagnosis not present

## 2016-08-12 DIAGNOSIS — I129 Hypertensive chronic kidney disease with stage 1 through stage 4 chronic kidney disease, or unspecified chronic kidney disease: Secondary | ICD-10-CM | POA: Diagnosis not present

## 2016-08-12 DIAGNOSIS — Z0000 Encounter for general adult medical examination without abnormal findings: Secondary | ICD-10-CM | POA: Diagnosis not present

## 2016-08-12 DIAGNOSIS — N182 Chronic kidney disease, stage 2 (mild): Secondary | ICD-10-CM | POA: Diagnosis not present

## 2016-09-03 DIAGNOSIS — H524 Presbyopia: Secondary | ICD-10-CM | POA: Diagnosis not present

## 2016-09-03 DIAGNOSIS — E119 Type 2 diabetes mellitus without complications: Secondary | ICD-10-CM | POA: Diagnosis not present

## 2016-09-03 DIAGNOSIS — H5203 Hypermetropia, bilateral: Secondary | ICD-10-CM | POA: Diagnosis not present

## 2016-09-25 ENCOUNTER — Other Ambulatory Visit: Payer: Medicare Other

## 2016-10-05 DIAGNOSIS — E1121 Type 2 diabetes mellitus with diabetic nephropathy: Secondary | ICD-10-CM | POA: Diagnosis not present

## 2016-10-05 DIAGNOSIS — I129 Hypertensive chronic kidney disease with stage 1 through stage 4 chronic kidney disease, or unspecified chronic kidney disease: Secondary | ICD-10-CM | POA: Diagnosis not present

## 2016-10-05 DIAGNOSIS — E785 Hyperlipidemia, unspecified: Secondary | ICD-10-CM | POA: Diagnosis not present

## 2016-10-05 DIAGNOSIS — E049 Nontoxic goiter, unspecified: Secondary | ICD-10-CM | POA: Diagnosis not present

## 2016-10-05 DIAGNOSIS — N182 Chronic kidney disease, stage 2 (mild): Secondary | ICD-10-CM | POA: Diagnosis not present

## 2016-10-16 ENCOUNTER — Ambulatory Visit
Admission: RE | Admit: 2016-10-16 | Discharge: 2016-10-16 | Disposition: A | Discharge status: Home / Self Care | Payer: Medicare Other | Source: Ambulatory Visit | Attending: Endocrinology | Admitting: Endocrinology

## 2016-10-16 DIAGNOSIS — E041 Nontoxic single thyroid nodule: Secondary | ICD-10-CM | POA: Diagnosis not present

## 2016-10-16 DIAGNOSIS — E049 Nontoxic goiter, unspecified: Secondary | ICD-10-CM

## 2016-12-18 IMAGING — US US THYROID
1 series · 13 of 25 positions shown · non-contrast
Comparison: 08/12/2015

CLINICAL DATA: Prior ultrasound follow-up.  Follow-up nodule

EXAM:
THYROID ULTRASOUND
TECHNIQUE: Ultrasound examination of the thyroid gland and adjacent soft
tissues was performed.

[Series 1: us thyroid · 0.06mm/px · 13 of 73 slices shown]
[im 1/73]
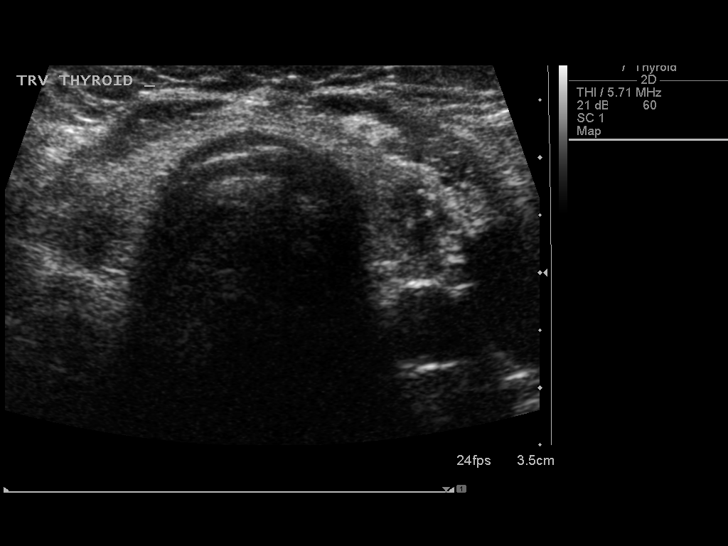
[im 7/73]
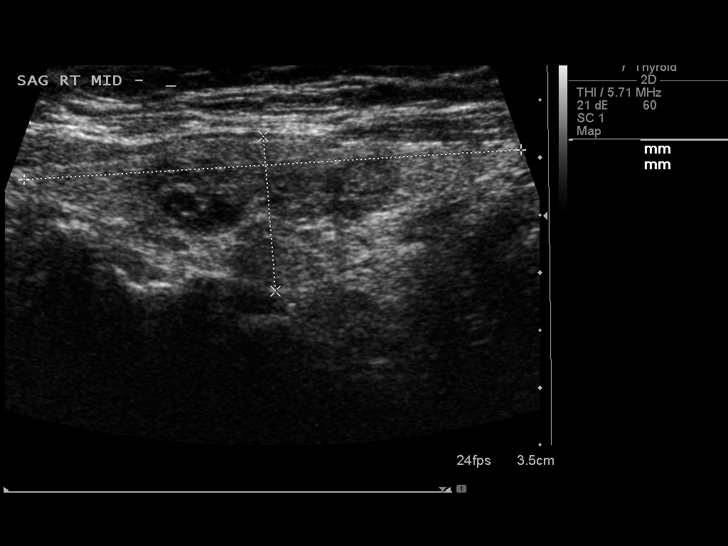
[im 13/73]
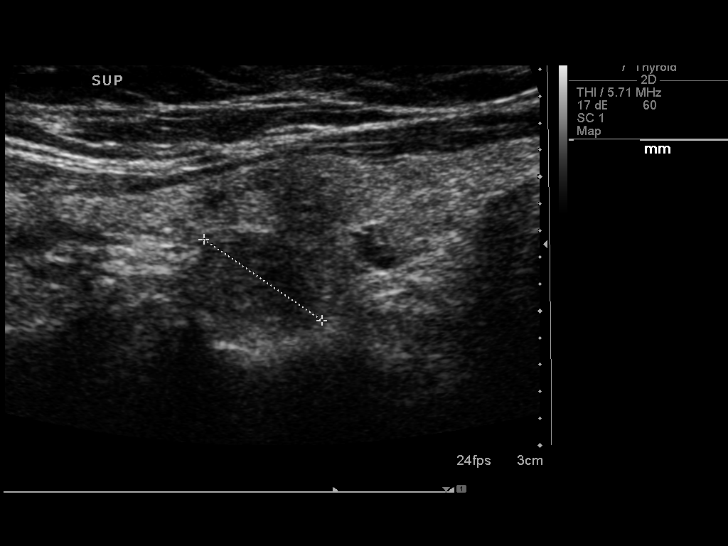
[im 19/73]
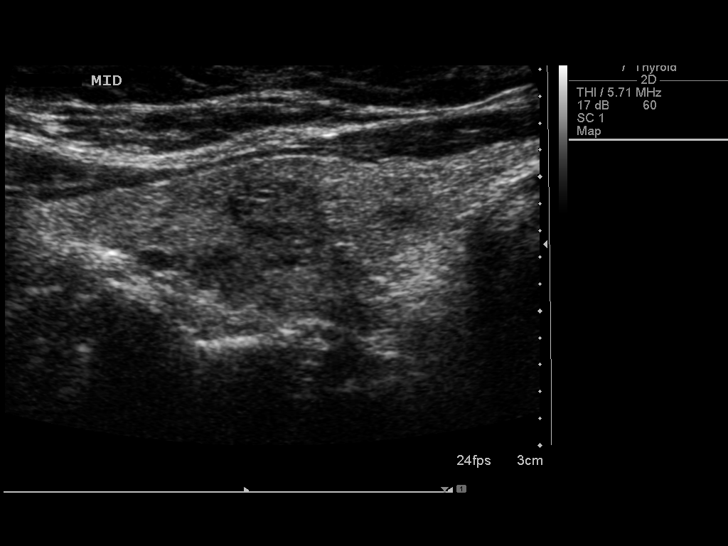
[im 25/73]
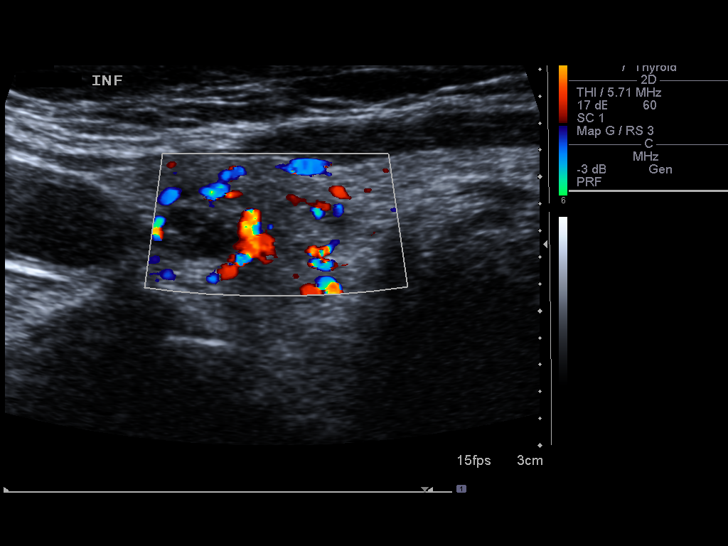
[im 31/73]
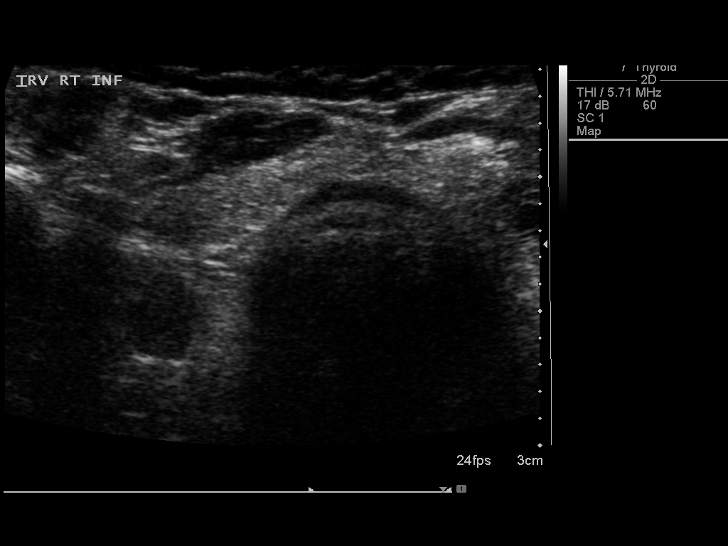
[im 37/73]
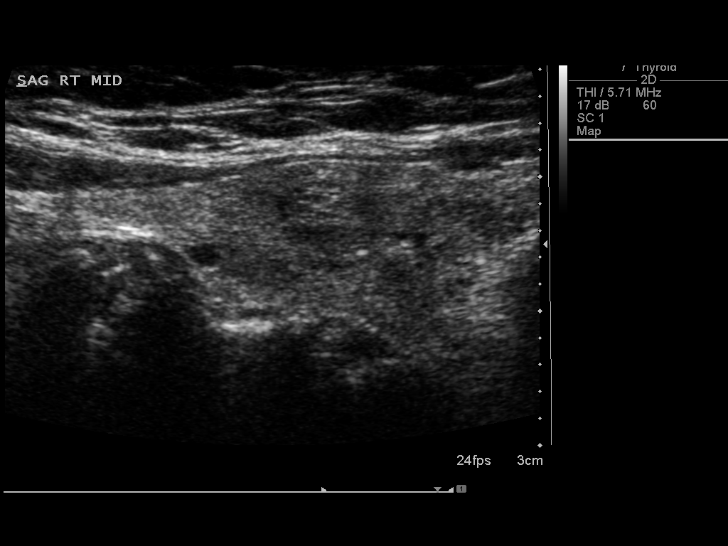
[im 43/73]
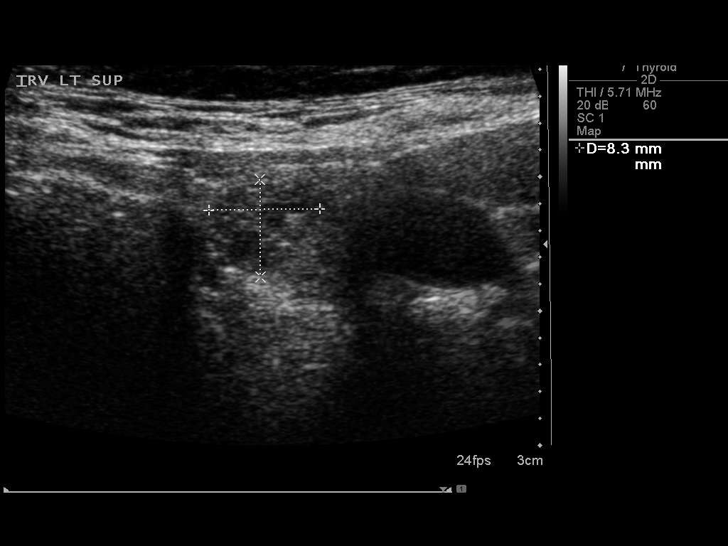
[im 49/73]
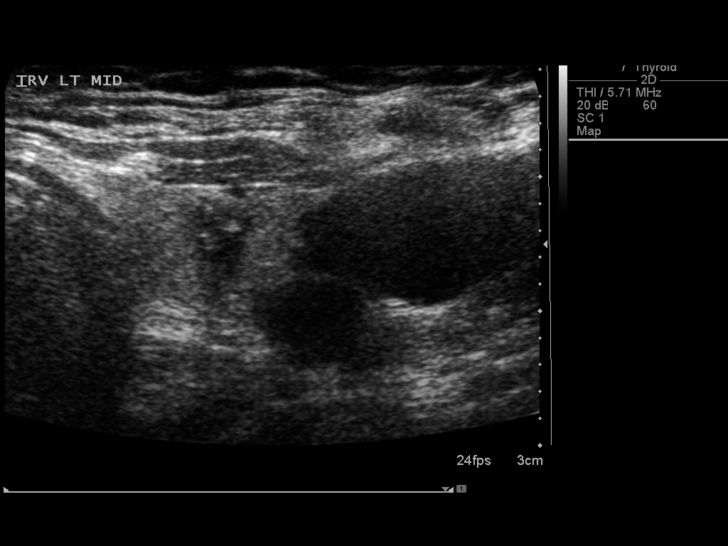
[im 55/73]
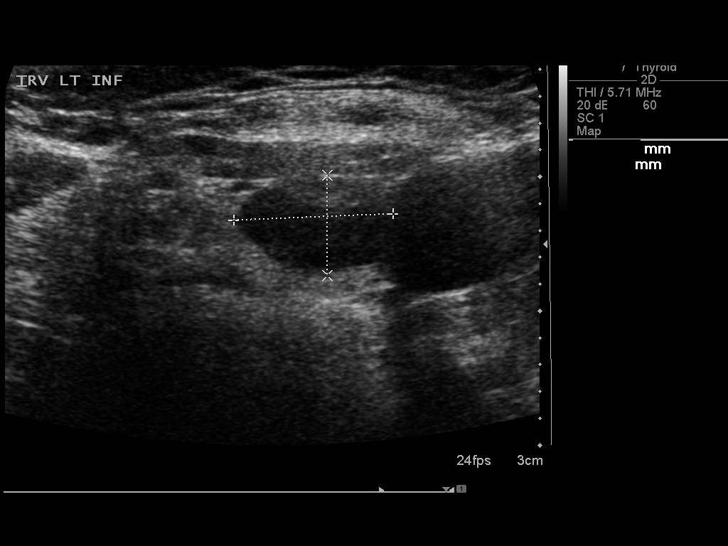
[im 61/73]
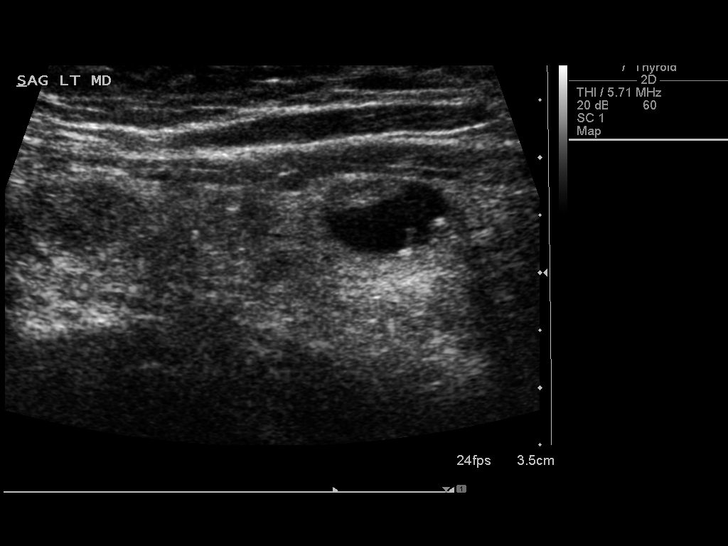
[im 67/73]
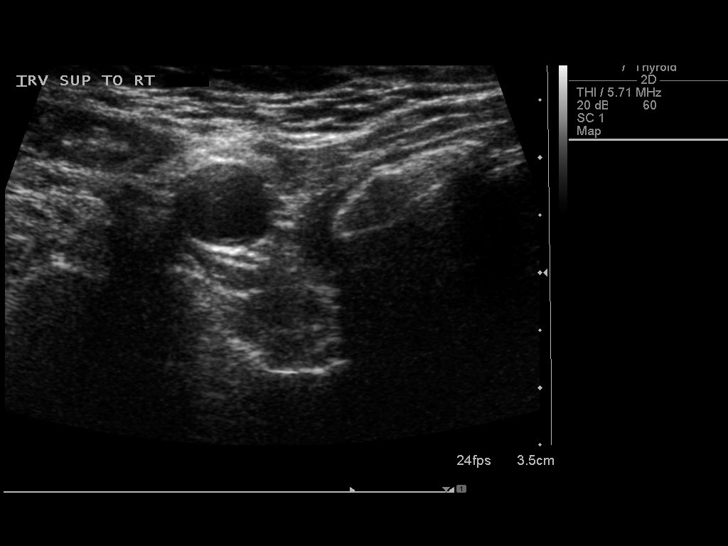
[im 73/73]
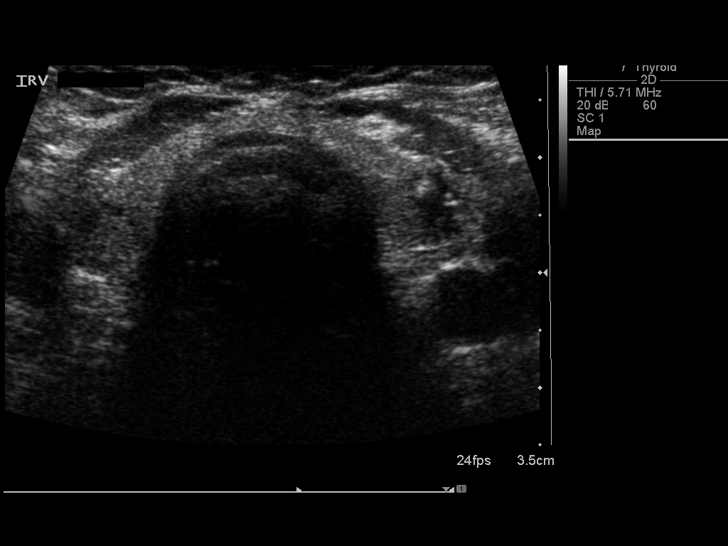

[13 of 25 positions shown; findings below may reference images not displayed]

FINDINGS: Parenchymal Echotexture: Moderately heterogenous

Estimated total number of nodules >/= 1 cm: 2

Number of spongiform nodules >/=  2 cm not described below (TR1): 0

Number of mixed cystic and solid nodules >/= 1.5 cm not described
below (TR2): 0

_________________________________________________________

Isthmus: 0.3 cm, previously 0.4 cm

No discrete nodules are identified within the thyroid isthmus.

_________________________________________________________

Right lobe: 4.3 x 1.4 x 1.4 cm, previously 3.8 x 1.3 x 1.5 cm

1.1 x 0.6 x 0.8 cm upper pole solid nodule is unchanged in
retrospect. See image 30 on the prior study.

0.8 cm mid lobe nodule previously measured 0.8 cm. 0.8 cm lower pole
nodule previously measured 0.7 cm and is not significantly changed.

0.8 cm lower pole nodule previously measured 0.7 cm and is not
significantly changed.

_________________________________________________________

Left lobe: 4.6 x 1.0 x 1.4 cm, previously 4.7 x 2.0 x 2.4 cm.

0.9 cm upper pole nodule previously measured 0.9 cm.

1.2 x 1.2 x 0.8 cm cyst in the lower pole is not significantly
changed and previously measured 1.2 cm.

Nodule # 1:

Location: Left; Mid

Size: 0.7 x 0.6 x

Composition: solid/almost completely solid (2)

Echogenicity: hypoechoic (2)

Shape: taller-than-wide (3)

Margins: smooth (0)

Echogenic foci: none (0)

ACR TI-RADS total points: 7.

ACR TI-RADS risk category: TR5 (>/= 7 points).

ACR TI-RADS recommendations:

*Given size (>/= 0.5 - 0.9 cm) and appearance, a follow-up
ultrasound in 1 year should be considered based on TI-RADS criteria.
IMPRESSION: There is a new nodules in the left mid gland measuring 0.8 cm and
annual follow-up is recommended for this TR5 lesion. Other small
nodules are not significantly changed bilaterally. The large cyst
seen previously has not recurred. It was aspirated in the interim.

The above is in keeping with the ACR TI-RADS recommendations - [HOSPITAL] 0877;[DATE].

## 2017-03-26 DIAGNOSIS — E785 Hyperlipidemia, unspecified: Secondary | ICD-10-CM | POA: Diagnosis not present

## 2017-03-26 DIAGNOSIS — E782 Mixed hyperlipidemia: Secondary | ICD-10-CM | POA: Diagnosis not present

## 2017-03-26 DIAGNOSIS — E1121 Type 2 diabetes mellitus with diabetic nephropathy: Secondary | ICD-10-CM | POA: Diagnosis not present

## 2017-03-26 DIAGNOSIS — R0989 Other specified symptoms and signs involving the circulatory and respiratory systems: Secondary | ICD-10-CM | POA: Diagnosis not present

## 2017-03-26 DIAGNOSIS — L821 Other seborrheic keratosis: Secondary | ICD-10-CM | POA: Diagnosis not present

## 2017-03-26 DIAGNOSIS — I129 Hypertensive chronic kidney disease with stage 1 through stage 4 chronic kidney disease, or unspecified chronic kidney disease: Secondary | ICD-10-CM | POA: Diagnosis not present

## 2017-03-26 DIAGNOSIS — N182 Chronic kidney disease, stage 2 (mild): Secondary | ICD-10-CM | POA: Diagnosis not present

## 2017-03-29 ENCOUNTER — Other Ambulatory Visit: Payer: Self-pay | Admitting: Family Medicine

## 2017-03-29 DIAGNOSIS — R0989 Other specified symptoms and signs involving the circulatory and respiratory systems: Secondary | ICD-10-CM

## 2017-05-13 DIAGNOSIS — Z79899 Other long term (current) drug therapy: Secondary | ICD-10-CM | POA: Diagnosis not present

## 2017-05-13 DIAGNOSIS — E785 Hyperlipidemia, unspecified: Secondary | ICD-10-CM | POA: Diagnosis not present

## 2017-08-18 DIAGNOSIS — E781 Pure hyperglyceridemia: Secondary | ICD-10-CM | POA: Diagnosis not present

## 2017-08-18 DIAGNOSIS — E785 Hyperlipidemia, unspecified: Secondary | ICD-10-CM | POA: Diagnosis not present

## 2017-08-18 DIAGNOSIS — E782 Mixed hyperlipidemia: Secondary | ICD-10-CM | POA: Diagnosis not present

## 2017-11-17 DIAGNOSIS — Z0000 Encounter for general adult medical examination without abnormal findings: Secondary | ICD-10-CM | POA: Diagnosis not present

## 2017-11-17 DIAGNOSIS — N182 Chronic kidney disease, stage 2 (mild): Secondary | ICD-10-CM | POA: Diagnosis not present

## 2017-11-17 DIAGNOSIS — I129 Hypertensive chronic kidney disease with stage 1 through stage 4 chronic kidney disease, or unspecified chronic kidney disease: Secondary | ICD-10-CM | POA: Diagnosis not present

## 2017-11-17 DIAGNOSIS — E785 Hyperlipidemia, unspecified: Secondary | ICD-10-CM | POA: Diagnosis not present

## 2017-11-17 DIAGNOSIS — N813 Complete uterovaginal prolapse: Secondary | ICD-10-CM | POA: Diagnosis not present

## 2017-11-17 DIAGNOSIS — E1151 Type 2 diabetes mellitus with diabetic peripheral angiopathy without gangrene: Secondary | ICD-10-CM | POA: Diagnosis not present

## 2017-11-17 DIAGNOSIS — E1121 Type 2 diabetes mellitus with diabetic nephropathy: Secondary | ICD-10-CM | POA: Diagnosis not present

## 2017-12-01 DIAGNOSIS — B9789 Other viral agents as the cause of diseases classified elsewhere: Secondary | ICD-10-CM | POA: Diagnosis not present

## 2017-12-01 DIAGNOSIS — J069 Acute upper respiratory infection, unspecified: Secondary | ICD-10-CM | POA: Diagnosis not present

## 2020-06-13 ENCOUNTER — Encounter: Payer: Self-pay | Admitting: Cardiology

## 2020-06-13 DIAGNOSIS — R002 Palpitations: Secondary | ICD-10-CM | POA: Insufficient documentation

## 2020-06-13 DIAGNOSIS — R9431 Abnormal electrocardiogram [ECG] [EKG]: Secondary | ICD-10-CM | POA: Insufficient documentation

## 2020-06-13 DIAGNOSIS — I10 Essential (primary) hypertension: Secondary | ICD-10-CM | POA: Insufficient documentation

## 2020-06-13 DIAGNOSIS — Z7189 Other specified counseling: Secondary | ICD-10-CM | POA: Insufficient documentation

## 2020-06-13 NOTE — Progress Notes (Signed)
Cardiology Office Note   Date:  06/14/2020   ID:  Angel Castaneda, DOB 1929-06-22, MRN [id number]  PCP:  Harlan Stains, MD  Cardiologist:   No primary care provider on file. Referring:  Harlan Stains, MD  Chief Complaint  Patient presents with   Palpitations      History of Present Illness: Angel Castaneda is a 86 y.o. female who presents for evaluation of heart flutters and an abnormal EKG.   the patient has no prior cardiac history.  She has been having lots of anxiety around Covid.  She mentions several times during the appointment.  She had stress with some family members.  She lives with her son who has some disability.  She said that she has been having episodes since about April of a very difficult to describe sensation shooting down the left side of her chest.  Its been under the armpit shooting down to the leg.  It might wake her at night.  She cannot really describe with this palpitations or fluttering.  It does not happen during the day.  She can be quite active and moving around the house without bringing on the symptoms.  She does not have any shortness of breath, PND or orthopnea.  She has never had any prior cardiac work-up.  She does her chores.  She feels like she has decreased energy but she is also not sleeping well because of anxiety.   Past Medical History:  Diagnosis Date   Cataract    Diabetes mellitus without complication ([place])    Borderline   Hypertension     Past Surgical History:  Procedure Laterality Date   EYE SURGERY     HERNIA REPAIR       Current Outpatient Medications  Medication Sig Dispense Refill   Loratadine (CLARITIN PO) Take by mouth. Patient takes as needed for her allergies     losartan (COZAAR) 100 MG tablet Take 100 mg by mouth daily.     escitalopram (LEXAPRO) 5 MG tablet Take 5 mg by mouth daily. (Patient not taking: Reported on 06/14/2020)     No current facility-administered medications for this visit.     Allergies:   Codeine    Social History:  The patient  reports that she has never smoked. She has never used smokeless tobacco.   Family History:  The patient's family history includes Cancer in her maternal aunt; Diabetes in her maternal aunt, maternal grandmother, and mother; Heart disease in her maternal grandfather, maternal uncle, and sister; Heart failure in her mother; Mental illness in her son; Stroke in her sister.    ROS:  Please see the history of present illness.   Otherwise, review of systems are positive for none.   All other systems are reviewed and negative.    PHYSICAL EXAM: VS:  BP (!) 161/77    Pulse 70    Ht 4\' 11"  (1.499 m)    Wt 120 lb (54.4 kg)    SpO2 100%    BMI 24.24 kg/m  , BMI Body mass index is 24.24 kg/m. GENERAL:  Well appearing HEENT:  Pupils equal round and reactive, fundi not visualized, oral mucosa unremarkable NECK:  No jugular venous distention, waveform within normal limits, carotid upstroke brisk and symmetric, no bruits, no thyromegaly LYMPHATICS:  No cervical, inguinal adenopathy LUNGS:  Clear to auscultation bilaterally BACK:  No CVA tenderness CHEST:  Unremarkable HEART:  PMI not displaced or sustained,S1 and S2 within normal limits, no S3,  no S4, no clicks, no rubs, no murmurs ABD:  Flat, positive bowel sounds normal in frequency in pitch, no bruits, no rebound, no guarding, no midline pulsatile mass, no hepatomegaly, no splenomegaly EXT:  2 plus pulses upper and decreased DP/PT bilateral, no edema, no cyanosis no clubbing SKIN:  No rashes no nodules NEURO:  Cranial nerves II through XII grossly intact, motor grossly intact throughout PSYCH:  Cognitively intact, oriented to person place and time    EKG:  EKG is not ordered today. The ekg ordered 06/10/20 demonstrates sinus rhythm, rate 60, leftward axis, poor anterior R wave progression, intervals within normal limits, no acute ST-T wave changes   Recent Labs: No results found for  requested labs within last 8760 hours.    Lipid Panel No results found for: CHOL, TRIG, HDL, CHOLHDL, VLDL, LDLCALC, LDLDIRECT    Wt Readings from Last 3 Encounters:  06/14/20 120 lb (54.4 kg)  12/14/12 123 lb 9.6 oz (56.1 kg)  09/23/12 128 lb (58.1 kg)      Other studies Reviewed: Additional studies/ records that were reviewed today include: Office records primary care. Review of the above records demonstrates:  Please see elsewhere in the note.     ASSESSMENT AND PLAN:  PALPITATIONS: The patient is describing vague symptoms.  Is not clear to me that she is having an arrhythmia but I cannot exclude that.  Therefore, I like her to wear a 3-day Zio patch.  I do note that her thyroid was normal earlier this year and she just had blood work a couple of days ago with normal potassium and renal function.  PAIN: Her chest pain is somewhat atypical.  I think the pretest probability of obstructive coronary disease is low.  I am not planning any ischemia work-up.  ABNORMAL EKG: She had poor anterior R wave progression but no clear evidence of anteroseptal infarct and I think her risk factors are low and what I would not suggest further testing.  HTN: The blood pressure is elevated but she is very anxious.  Her family will keep a blood pressure diary and let me know if it is elevated at home.  She will continue the meds as listed.  COVID EDUCATION: She has had her [employer] vaccine.  Current medicines are reviewed at length with the patient today.  The patient does not have concerns regarding medicines.  The following changes have been made:  no change  Labs/ tests ordered today include:   Orders Placed This Encounter  Procedures   LONG TERM MONITOR (3-14 DAYS)     Disposition:   FU with me as needed.      Signed, Minus Breeding, MD  06/14/2020 12:54 PM    [facility]

## 2020-06-14 ENCOUNTER — Encounter: Payer: Self-pay | Admitting: Cardiology

## 2020-06-14 ENCOUNTER — Telehealth: Payer: Self-pay

## 2020-06-14 ENCOUNTER — Ambulatory Visit (INDEPENDENT_AMBULATORY_CARE_PROVIDER_SITE_OTHER): Payer: Medicare Other | Admitting: Cardiology

## 2020-06-14 ENCOUNTER — Other Ambulatory Visit: Payer: Self-pay

## 2020-06-14 VITALS — BP 161/77 | HR 70 | Ht 59.0 in | Wt 120.0 lb

## 2020-06-14 DIAGNOSIS — R9431 Abnormal electrocardiogram [ECG] [EKG]: Secondary | ICD-10-CM

## 2020-06-14 DIAGNOSIS — I10 Essential (primary) hypertension: Secondary | ICD-10-CM

## 2020-06-14 DIAGNOSIS — Z7189 Other specified counseling: Secondary | ICD-10-CM

## 2020-06-14 DIAGNOSIS — R002 Palpitations: Secondary | ICD-10-CM

## 2020-06-14 NOTE — Telephone Encounter (Signed)
3 day ZIO XT registered to be mailed to pt's home.

## 2020-06-14 NOTE — Patient Instructions (Signed)
Medication Instructions:  Your physician recommends that you continue on your current medications as directed. Please refer to the Current Medication list given to you today.  Lab Work: NONE  Testing/Procedures: 3 DAY ZIO MONITOR    Follow-Up: AS NEEDED   Other Instructions  ZIO XT- Long Term Monitor Instructions   Your physician has requested you wear your ZIO patch monitor___3____days.   This is a single patch monitor.  [employer] supplies one patch monitor per enrollment.  Additional stickers are not available.   Please do not apply patch if you will be having a Nuclear Stress Test, Echocardiogram, Cardiac CT, MRI, or Chest Xray during the time frame you would be wearing the monitor. The patch cannot be worn during these tests.  You cannot remove and re-apply the ZIO XT patch monitor.   Your ZIO patch monitor will be sent USPS Priority mail from [employer] directly to your home address. The monitor may also be mailed to a PO BOX if home delivery is not available.   It may take 3-5 days to receive your monitor after you have been enrolled.   Once you have received you monitor, please review enclosed instructions.  Your monitor has already been registered assigning a specific monitor serial # to you.   Applying the monitor   Shave hair from upper left chest.   Hold abrader disc by orange tab.  Rub abrader in 40 strokes over left upper chest as indicated in your monitor instructions.   Clean area with 4 enclosed alcohol pads .  Use all pads to assure are is cleaned thoroughly.  Let dry.   Apply patch as indicated in monitor instructions.  Patch will be place under collarbone on left side of chest with arrow pointing upward.   Rub patch adhesive wings for 2 minutes.Remove white label marked "1".  Remove white label marked "2".  Rub patch adhesive wings for 2 additional minutes.   While looking in a mirror, press and release button in center of patch.  A small green light  will flash 3-4 times .  This will be your only indicator the monitor has been turned on.     Do not shower for the first 24 hours.  You may shower after the first 24 hours.   Press button if you feel a symptom. You will hear a small click.  Record Date, Time and Symptom in the Patient Log Book.   When you are ready to remove patch, follow instructions on last 2 pages of Patient Log Book.  Stick patch monitor onto last page of Patient Log Book.   Place Patient Log Book in Blue box.  Use locking tab on box and tape box closed securely.  The Orange and White box has prepaid postage on it.  Please place in mailbox as soon as possible.  Your physician should have your test results approximately 7 days after the monitor has been mailed back to [employer].   Call [employer] Customer Care at [phone] if you have questions regarding your ZIO XT patch monitor.  Call them immediately if you see an orange light blinking on your monitor.   If your monitor falls off in less than 4 days contact our Monitor department at [phone].  If your monitor becomes loose or falls off after 4 days call [employer] at [phone] for suggestions on securing your monitor.   

## 2020-06-21 NOTE — Telephone Encounter (Signed)
Follow up   Patient needs assistance in putting monitor on. Please call to discuss.

## 2020-06-24 NOTE — Telephone Encounter (Signed)
Follow up   Pt is returning call, need help putting on heart monitor.

## 2020-06-24 NOTE — Telephone Encounter (Signed)
Attempted to reach the patient. Line was busy

## 2020-06-25 NOTE — Telephone Encounter (Signed)
Pt not sure how to put monitor on Instructed pt to call monitoring company to have them walk her through Pt states I am 86 years old and I would rather come in to the office and have someone put it on Will forward to to [place] for review ./cy

## 2020-06-26 NOTE — Telephone Encounter (Signed)
Returned call to patient she will come to the office on 7/21 and have monitor applied

## 2020-07-03 ENCOUNTER — Other Ambulatory Visit: Payer: Self-pay

## 2020-07-03 ENCOUNTER — Ambulatory Visit (INDEPENDENT_AMBULATORY_CARE_PROVIDER_SITE_OTHER): Payer: Medicare Other

## 2020-07-03 DIAGNOSIS — R002 Palpitations: Secondary | ICD-10-CM

## 2020-07-03 DIAGNOSIS — R9431 Abnormal electrocardiogram [ECG] [EKG]: Secondary | ICD-10-CM | POA: Diagnosis not present

## 2020-07-03 DIAGNOSIS — I10 Essential (primary) hypertension: Secondary | ICD-10-CM

## 2020-07-22 ENCOUNTER — Telehealth: Payer: Self-pay | Admitting: Cardiology

## 2020-07-22 NOTE — Telephone Encounter (Signed)
Routed to Dr. Percival Spanish - report scanned in epic but not resulted

## 2020-07-22 NOTE — Telephone Encounter (Signed)
Angel Castaneda is calling requesting her monitor results. Please advise.

## 2021-02-04 ENCOUNTER — Other Ambulatory Visit: Payer: Self-pay | Admitting: Family Medicine

## 2021-02-04 DIAGNOSIS — E2839 Other primary ovarian failure: Secondary | ICD-10-CM

## 2021-07-14 ENCOUNTER — Other Ambulatory Visit: Payer: Medicare Other
